# Patient Record
Sex: Female | Born: 1965 | Race: White | Hispanic: No | Marital: Married | Smoking: Never smoker
Health system: Southern US, Community
[De-identification: ages and names within clinical notes are randomized; demographics above are authoritative.]

## PROBLEM LIST (undated history)

## (undated) DIAGNOSIS — E039 Hypothyroidism, unspecified: Secondary | ICD-10-CM

## (undated) DIAGNOSIS — F419 Anxiety disorder, unspecified: Secondary | ICD-10-CM

## (undated) DIAGNOSIS — F99 Mental disorder, not otherwise specified: Secondary | ICD-10-CM

## (undated) DIAGNOSIS — I10 Essential (primary) hypertension: Secondary | ICD-10-CM

## (undated) HISTORY — PX: BACK SURGERY: SHX140

## (undated) HISTORY — PX: OTHER SURGICAL HISTORY: SHX169

## (undated) HISTORY — PX: ANTERIOR CERVICAL DECOMP/DISCECTOMY FUSION: SHX1161

---

## 2001-03-12 ENCOUNTER — Inpatient Hospital Stay (HOSPITAL_COMMUNITY): Admission: AD | Admit: 2001-03-12 | Discharge: 2001-03-15 | Payer: Self-pay | Admitting: Obstetrics and Gynecology

## 2003-01-23 ENCOUNTER — Other Ambulatory Visit: Admission: RE | Admit: 2003-01-23 | Discharge: 2003-01-23 | Payer: Self-pay | Admitting: Obstetrics and Gynecology

## 2003-03-09 ENCOUNTER — Ambulatory Visit (HOSPITAL_COMMUNITY): Admission: RE | Admit: 2003-03-09 | Discharge: 2003-03-09 | Payer: Self-pay | Admitting: Obstetrics and Gynecology

## 2003-03-09 ENCOUNTER — Encounter: Payer: Self-pay | Admitting: Obstetrics and Gynecology

## 2004-04-24 ENCOUNTER — Other Ambulatory Visit: Admission: RE | Admit: 2004-04-24 | Discharge: 2004-04-24 | Payer: Self-pay | Admitting: Obstetrics and Gynecology

## 2005-06-11 ENCOUNTER — Other Ambulatory Visit: Admission: RE | Admit: 2005-06-11 | Discharge: 2005-06-11 | Payer: Self-pay | Admitting: Obstetrics and Gynecology

## 2005-10-13 ENCOUNTER — Ambulatory Visit (HOSPITAL_COMMUNITY): Admission: RE | Admit: 2005-10-13 | Discharge: 2005-10-14 | Payer: Self-pay | Admitting: Neurosurgery

## 2011-02-28 ENCOUNTER — Other Ambulatory Visit: Payer: Self-pay | Admitting: Family Medicine

## 2011-02-28 DIAGNOSIS — N6002 Solitary cyst of left breast: Secondary | ICD-10-CM

## 2011-03-10 ENCOUNTER — Other Ambulatory Visit: Payer: Self-pay | Admitting: Family Medicine

## 2011-03-10 ENCOUNTER — Ambulatory Visit
Admission: RE | Admit: 2011-03-10 | Discharge: 2011-03-10 | Disposition: A | Discharge status: Home / Self Care | Payer: BC Managed Care – PPO | Source: Ambulatory Visit | Attending: Family Medicine | Admitting: Family Medicine

## 2011-03-10 DIAGNOSIS — N6002 Solitary cyst of left breast: Secondary | ICD-10-CM

## 2011-07-22 ENCOUNTER — Other Ambulatory Visit: Payer: Self-pay | Admitting: Family Medicine

## 2011-07-22 DIAGNOSIS — N6320 Unspecified lump in the left breast, unspecified quadrant: Secondary | ICD-10-CM

## 2011-07-22 DIAGNOSIS — Z09 Encounter for follow-up examination after completed treatment for conditions other than malignant neoplasm: Secondary | ICD-10-CM

## 2011-08-05 ENCOUNTER — Other Ambulatory Visit: Payer: BC Managed Care – PPO

## 2011-09-16 ENCOUNTER — Ambulatory Visit
Admission: RE | Admit: 2011-09-16 | Discharge: 2011-09-16 | Disposition: A | Discharge status: Home / Self Care | Payer: BC Managed Care – PPO | Source: Ambulatory Visit | Attending: Family Medicine | Admitting: Family Medicine

## 2011-09-16 DIAGNOSIS — Z09 Encounter for follow-up examination after completed treatment for conditions other than malignant neoplasm: Secondary | ICD-10-CM

## 2011-09-16 DIAGNOSIS — N6320 Unspecified lump in the left breast, unspecified quadrant: Secondary | ICD-10-CM

## 2012-06-17 ENCOUNTER — Encounter (HOSPITAL_BASED_OUTPATIENT_CLINIC_OR_DEPARTMENT_OTHER): Payer: Self-pay | Admitting: *Deleted

## 2012-06-17 NOTE — Progress Notes (Signed)
Bring all medications. Pt to call Glenice Laine to set up payment plan.

## 2012-06-18 ENCOUNTER — Encounter (HOSPITAL_BASED_OUTPATIENT_CLINIC_OR_DEPARTMENT_OTHER)
Admission: RE | Admit: 2012-06-18 | Discharge: 2012-06-18 | Disposition: A | Discharge status: Home / Self Care | Payer: BC Managed Care – PPO | Source: Ambulatory Visit | Attending: Orthopaedic Surgery | Admitting: Orthopaedic Surgery

## 2012-06-18 LAB — BASIC METABOLIC PANEL
BUN: 12 mg/dL (ref 6–23)
CO2: 22 mEq/L (ref 19–32)
Calcium: 9.7 mg/dL (ref 8.4–10.5)
Chloride: 102 mEq/L (ref 96–112)
Creatinine, Ser: 0.64 mg/dL (ref 0.50–1.10)
GFR calc Af Amer: >90 mL/min (ref >90)
GFR calc non Af Amer: >90 mL/min (ref >90)
Glucose, Bld: 89 mg/dL (ref 70–99)
Potassium: 3.9 mEq/L (ref 3.5–5.1)
Sodium: 138 mEq/L (ref 135–145)

## 2012-06-20 NOTE — H&P (Signed)
Wendy Huang is an 46 y.o. female.   Chief Complaint: Right knee pain HPI: She has a multiple month history of right knee pain and reduced motion. C/o of medial pain and swelling.  X-ray is wnl but MRI scan reveals a tmm. We have offered her arthroscopy to take care of this problem.  Past Medical History  Diagnosis Date  . Hypertension   . Hypothyroidism   . Anxiety   . Mental disorder     obessive-compulsive disorder    Past Surgical History  Procedure Date  . Anterior cervical decomp/discectomy fusion   . Reimplanted right ureter     at age 66 years  . Back surgery     cervical fusion    History reviewed. No pertinent family history. Social History:  reports that she has never smoked. She does not have any smokeless tobacco history on file. She reports that she drinks alcohol. She reports that she does not use illicit drugs.  Allergies: No Known Allergies  No prescriptions prior to admission    No results found for this or any previous visit (from the past 48 hour(s)). No results found.  Review of Systems  Constitutional: Negative.   HENT: Negative.   Eyes: Negative.   Respiratory: Negative.   Cardiovascular: Negative.   Gastrointestinal: Negative.   Genitourinary: Negative.   Musculoskeletal: Negative.   Skin: Negative.   Neurological: Negative.   Endo/Heme/Allergies: Negative.   Psychiatric/Behavioral: Negative.     Height 5\' 2"  (1.575 m), weight 86.183 kg (190 lb), last menstrual period 05/19/2012. Physical Exam  Constitutional: She appears well-nourished.  HENT:  Head: Atraumatic.  Eyes: Pupils are equal, round, and reactive to light.  Neck: Normal range of motion.  Cardiovascular: Regular rhythm.   Respiratory: Breath sounds normal.  GI: Soft.  Musculoskeletal:       Right knee pain at medial joint line ROM 5-120 +effusion  Good n/v  Neurological: She is alert.  Skin: Skin is warm.  Psychiatric: She has a normal mood and affect.      Assessment/Plan A: Right knee TMM P: Will proceed with a right knee scope to improve function and reduce pain. We have reviewed the risk / benefits of knee arthroscopy and the need for therapy post op.  Josmar Messimer R 06/20/2012, 8:04 PM

## 2012-06-22 ENCOUNTER — Encounter (HOSPITAL_BASED_OUTPATIENT_CLINIC_OR_DEPARTMENT_OTHER)
Admission: RE | Disposition: A | Discharge status: Home / Self Care | Payer: Self-pay | Source: Ambulatory Visit | Attending: Orthopaedic Surgery

## 2012-06-22 ENCOUNTER — Encounter (HOSPITAL_BASED_OUTPATIENT_CLINIC_OR_DEPARTMENT_OTHER): Payer: Self-pay | Admitting: Certified Registered Nurse Anesthetist

## 2012-06-22 ENCOUNTER — Ambulatory Visit (HOSPITAL_BASED_OUTPATIENT_CLINIC_OR_DEPARTMENT_OTHER)
Admission: RE | Admit: 2012-06-22 | Discharge: 2012-06-22 | Disposition: A | Discharge status: Home / Self Care | Payer: BC Managed Care – PPO | Source: Ambulatory Visit | Attending: Orthopaedic Surgery | Admitting: Orthopaedic Surgery

## 2012-06-22 ENCOUNTER — Ambulatory Visit (HOSPITAL_BASED_OUTPATIENT_CLINIC_OR_DEPARTMENT_OTHER): Payer: BC Managed Care – PPO | Admitting: Certified Registered Nurse Anesthetist

## 2012-06-22 ENCOUNTER — Encounter (HOSPITAL_BASED_OUTPATIENT_CLINIC_OR_DEPARTMENT_OTHER): Payer: Self-pay | Admitting: *Deleted

## 2012-06-22 DIAGNOSIS — M2240 Chondromalacia patellae, unspecified knee: Secondary | ICD-10-CM | POA: Insufficient documentation

## 2012-06-22 DIAGNOSIS — F411 Generalized anxiety disorder: Secondary | ICD-10-CM | POA: Insufficient documentation

## 2012-06-22 DIAGNOSIS — E039 Hypothyroidism, unspecified: Secondary | ICD-10-CM | POA: Insufficient documentation

## 2012-06-22 DIAGNOSIS — I10 Essential (primary) hypertension: Secondary | ICD-10-CM | POA: Insufficient documentation

## 2012-06-22 DIAGNOSIS — Z01810 Encounter for preprocedural cardiovascular examination: Secondary | ICD-10-CM | POA: Insufficient documentation

## 2012-06-22 DIAGNOSIS — Z01812 Encounter for preprocedural laboratory examination: Secondary | ICD-10-CM | POA: Insufficient documentation

## 2012-06-22 DIAGNOSIS — M23329 Other meniscus derangements, posterior horn of medial meniscus, unspecified knee: Secondary | ICD-10-CM | POA: Insufficient documentation

## 2012-06-22 DIAGNOSIS — S83249A Other tear of medial meniscus, current injury, unspecified knee, initial encounter: Secondary | ICD-10-CM

## 2012-06-22 HISTORY — DX: Essential (primary) hypertension: I10

## 2012-06-22 HISTORY — PX: KNEE ARTHROSCOPY: SHX127

## 2012-06-22 HISTORY — DX: Mental disorder, not otherwise specified: F99

## 2012-06-22 HISTORY — DX: Hypothyroidism, unspecified: E03.9

## 2012-06-22 HISTORY — DX: Anxiety disorder, unspecified: F41.9

## 2012-06-22 LAB — POCT HEMOGLOBIN-HEMACUE: Hemoglobin: 13.5 g/dL (ref 12.0–15.0)

## 2012-06-22 SURGERY — ARTHROSCOPY, KNEE
Anesthesia: General | Site: Knee | Laterality: Right | Wound class: Clean

## 2012-06-22 MED ORDER — CEFAZOLIN SODIUM 1-5 GM-% IV SOLN
INTRAVENOUS | Status: DC | PRN
  Administered 2012-06-22: 2 g via INTRAVENOUS

## 2012-06-22 MED ORDER — BUPIVACAINE HCL (PF) 0.5 % IJ SOLN
INTRAMUSCULAR | Status: DC | PRN
  Administered 2012-06-22: 20 mL via INTRA_ARTICULAR

## 2012-06-22 MED ORDER — FENTANYL CITRATE 0.05 MG/ML IJ SOLN
INTRAMUSCULAR | Status: DC | PRN
  Administered 2012-06-22: 50 ug via INTRAVENOUS
  Administered 2012-06-22 (×2): 25 ug via INTRAVENOUS
  Administered 2012-06-22: 50 ug via INTRAVENOUS
  Administered 2012-06-22 (×2): 25 ug via INTRAVENOUS

## 2012-06-22 MED ORDER — ONDANSETRON HCL 4 MG/2ML IJ SOLN
INTRAMUSCULAR | Status: DC | PRN
  Administered 2012-06-22: 4 mg via INTRAVENOUS

## 2012-06-22 MED ORDER — DEXAMETHASONE SODIUM PHOSPHATE 4 MG/ML IJ SOLN
INTRAMUSCULAR | Status: DC | PRN
  Administered 2012-06-22: 10 mg via INTRAVENOUS

## 2012-06-22 MED ORDER — PROPOFOL 10 MG/ML IV EMUL
INTRAVENOUS | Status: DC | PRN
  Administered 2012-06-22: 200 mg via INTRAVENOUS

## 2012-06-22 MED ORDER — OXYCODONE-ACETAMINOPHEN 5-325 MG PO TABS: 1.0000 | ORAL_TABLET | ORAL | Status: AC | PRN

## 2012-06-22 MED ORDER — LIDOCAINE HCL (CARDIAC) 20 MG/ML IV SOLN
INTRAVENOUS | Status: DC | PRN
  Administered 2012-06-22: 50 mg via INTRAVENOUS

## 2012-06-22 MED ORDER — ONDANSETRON HCL 4 MG/2ML IJ SOLN: 4.0000 mg | Freq: Once | INTRAMUSCULAR | Status: DC | PRN

## 2012-06-22 MED ORDER — OXYCODONE-ACETAMINOPHEN 5-325 MG PO TABS
1.0000 | ORAL_TABLET | Freq: Once | ORAL | Status: AC
  Administered 2012-06-22: 1 via ORAL

## 2012-06-22 MED ORDER — SODIUM CHLORIDE 0.9 % IR SOLN
Status: DC | PRN
  Administered 2012-06-22: 3

## 2012-06-22 MED ORDER — LACTATED RINGERS IV SOLN
INTRAVENOUS | Status: DC
  Administered 2012-06-22 (×2): via INTRAVENOUS

## 2012-06-22 MED ORDER — HYDROMORPHONE HCL PF 1 MG/ML IJ SOLN
0.2500 mg | INTRAMUSCULAR | Status: DC | PRN
  Administered 2012-06-22: 0.5 mg via INTRAVENOUS
  Administered 2012-06-22: 0.25 mg via INTRAVENOUS

## 2012-06-22 MED ORDER — MIDAZOLAM HCL 5 MG/5ML IJ SOLN
INTRAMUSCULAR | Status: DC | PRN
  Administered 2012-06-22: 2 mg via INTRAVENOUS

## 2012-06-22 SURGICAL SUPPLY — 40 items
BANDAGE ELASTIC 6 VELCRO ST LF (GAUZE/BANDAGES/DRESSINGS) ×2 IMPLANT
BANDAGE GAUZE ELAST BULKY 4 IN (GAUZE/BANDAGES/DRESSINGS) ×2 IMPLANT
BLADE CUDA 5.5 (BLADE) IMPLANT
BLADE CUDA GRT WHITE 3.5 (BLADE) ×2 IMPLANT
BLADE GREAT WHITE 4.2 (BLADE) ×2 IMPLANT
CANISTER OMNI JUG 16 LITER (MISCELLANEOUS) ×2 IMPLANT
CANISTER SUCTION 2500CC (MISCELLANEOUS) IMPLANT
DRAPE ARTHROSCOPY W/POUCH 114 (DRAPES) ×2 IMPLANT
DRAPE U-SHAPE 47X51 STRL (DRAPES) ×2 IMPLANT
DRSG EMULSION OIL 3X3 NADH (GAUZE/BANDAGES/DRESSINGS) ×2 IMPLANT
DURAPREP 26ML APPLICATOR (WOUND CARE) ×2 IMPLANT
ELECT MENISCUS 165MM 90D (ELECTRODE) IMPLANT
ELECT REM PT RETURN 9FT ADLT (ELECTROSURGICAL)
ELECTRODE REM PT RTRN 9FT ADLT (ELECTROSURGICAL) IMPLANT
GLOVE BIO SURGEON STRL SZ 6.5 (GLOVE) ×2 IMPLANT
GLOVE BIO SURGEON STRL SZ8.5 (GLOVE) ×2 IMPLANT
GLOVE BIOGEL PI IND STRL 7.0 (GLOVE) ×1 IMPLANT
GLOVE BIOGEL PI IND STRL 8 (GLOVE) ×1 IMPLANT
GLOVE BIOGEL PI IND STRL 8.5 (GLOVE) ×1 IMPLANT
GLOVE BIOGEL PI INDICATOR 7.0 (GLOVE) ×1
GLOVE BIOGEL PI INDICATOR 8 (GLOVE) ×1
GLOVE BIOGEL PI INDICATOR 8.5 (GLOVE) ×1
GLOVE SS BIOGEL STRL SZ 8 (GLOVE) ×1 IMPLANT
GLOVE SUPERSENSE BIOGEL SZ 8 (GLOVE) ×1
GOWN PREVENTION PLUS XLARGE (GOWN DISPOSABLE) ×4 IMPLANT
GOWN PREVENTION PLUS XXLARGE (GOWN DISPOSABLE) ×2 IMPLANT
KNEE WRAP E Z 3 GEL PACK (MISCELLANEOUS) ×2 IMPLANT
PACK ARTHROSCOPY DSU (CUSTOM PROCEDURE TRAY) ×2 IMPLANT
PACK BASIN DAY SURGERY FS (CUSTOM PROCEDURE TRAY) ×2 IMPLANT
PENCIL BUTTON HOLSTER BLD 10FT (ELECTRODE) IMPLANT
SET ARTHROSCOPY TUBING (MISCELLANEOUS) ×1
SET ARTHROSCOPY TUBING LN (MISCELLANEOUS) ×1 IMPLANT
SHEET MEDIUM DRAPE 40X70 STRL (DRAPES) ×2 IMPLANT
SPONGE GAUZE 4X4 12PLY (GAUZE/BANDAGES/DRESSINGS) ×2 IMPLANT
SYR 3ML 18GX1 1/2 (SYRINGE) IMPLANT
TOWEL OR 17X24 6PK STRL BLUE (TOWEL DISPOSABLE) ×2 IMPLANT
TOWEL OR NON WOVEN STRL DISP B (DISPOSABLE) ×2 IMPLANT
WAND 30 DEG SABER W/CORD (SURGICAL WAND) IMPLANT
WAND STAR VAC 90 (SURGICAL WAND) IMPLANT
WATER STERILE IRR 1000ML POUR (IV SOLUTION) ×2 IMPLANT

## 2012-06-22 NOTE — Anesthesia Procedure Notes (Signed)
Procedure Name: LMA Insertion Date/Time: 06/22/2012 11:24 AM Performed by: Iram Lundberg D Pre-anesthesia Checklist: Patient identified, Emergency Drugs available, Suction available and Patient being monitored Patient Re-evaluated:Patient Re-evaluated prior to inductionOxygen Delivery Method: Circle System Utilized Preoxygenation: Pre-oxygenation with 100% oxygen Intubation Type: IV induction Ventilation: Mask ventilation without difficulty LMA: LMA inserted LMA Size: 4.0 Number of attempts: 1 Airway Equipment and Method: bite block Placement Confirmation: positive ETCO2 Tube secured with: Tape Dental Injury: Teeth and Oropharynx as per pre-operative assessment

## 2012-06-22 NOTE — Op Note (Signed)
#  [id number] 

## 2012-06-22 NOTE — Anesthesia Postprocedure Evaluation (Signed)
  Anesthesia Post-op Note  Patient: Wendy Huang  Procedure(s) Performed: Procedure(s) (LRB): ARTHROSCOPY KNEE (Right)  Patient Location: PACU  Anesthesia Type: General  Level of Consciousness: awake, alert  and oriented  Airway and Oxygen Therapy: Patient Spontanous Breathing  Post-op Pain: mild  Post-op Assessment: Post-op Vital signs reviewed  Post-op Vital Signs: Reviewed  Complications: No apparent anesthesia complications

## 2012-06-22 NOTE — Transfer of Care (Signed)
Immediate Anesthesia Transfer of Care Note  Patient: Wendy Huang  Procedure(s) Performed: Procedure(s) (LRB): ARTHROSCOPY KNEE (Right)  Patient Location: PACU  Anesthesia Type: General  Level of Consciousness: awake, alert , oriented and patient cooperative  Airway & Oxygen Therapy: Patient Spontanous Breathing and Patient connected to face mask oxygen  Post-op Assessment: Report given to PACU RN and Post -op Vital signs reviewed and stable  Post vital signs: Reviewed and stable  Complications: No apparent anesthesia complications

## 2012-06-22 NOTE — Anesthesia Preprocedure Evaluation (Signed)
Anesthesia Evaluation  Patient identified by MRN, date of birth, ID band Patient awake    Reviewed: Allergy & Precautions, H&P , NPO status , Patient's Chart, lab work & pertinent test results  Airway Mallampati: I TM Distance: >3 FB Neck ROM: Full    Dental  (+) Dental Advisory Given   Pulmonary  breath sounds clear to auscultation        Cardiovascular hypertension, Pt. on medications Rhythm:Regular     Neuro/Psych    GI/Hepatic   Endo/Other  Hypothyroidism Morbid obesity  Renal/GU      Musculoskeletal   Abdominal   Peds  Hematology   Anesthesia Other Findings   Reproductive/Obstetrics                           Anesthesia Physical Anesthesia Plan  ASA: II  Anesthesia Plan: General   Post-op Pain Management:    Induction: Intravenous  Airway Management Planned: LMA  Additional Equipment:   Intra-op Plan:   Post-operative Plan:   Informed Consent: I have reviewed the patients History and Physical, chart, labs and discussed the procedure including the risks, benefits and alternatives for the proposed anesthesia with the patient or authorized representative who has indicated his/her understanding and acceptance.   Dental advisory given  Plan Discussed with: CRNA, Anesthesiologist and Surgeon  Anesthesia Plan Comments:         Anesthesia Quick Evaluation

## 2012-06-22 NOTE — Interval H&P Note (Signed)
History and Physical Interval Note:  06/22/2012 10:52 AM  Wendy Huang  has presented today for surgery, with the diagnosis of right knee mmt, chondromalacia  The various methods of treatment have been discussed with the patient and family. After consideration of risks, benefits and other options for treatment, the patient has consented to  Procedure(s) (LRB): ARTHROSCOPY KNEE (Right) as a surgical intervention .  The patient's history has been reviewed, patient examined, no change in status, stable for surgery.  I have reviewed the patients' chart and labs.  Questions were answered to the patient's satisfaction.     [name] G

## 2012-06-23 NOTE — Op Note (Signed)
Wendy Huang, Wendy Huang            ACCOUNT NO.:  [id number]  MEDICAL RECORD NO.:  [id number]  LOCATION:                                 FACILITY:  PHYSICIAN:  Lubertha Basque. Godson Pollan, M.D.DATE OF BIRTH:  January 26, 1966  DATE OF PROCEDURE:  06/22/2012 DATE OF DISCHARGE:                              OPERATIVE REPORT   PREOPERATIVE DIAGNOSES: 1. Right knee torn medial meniscus. 2. Right knee chondromalacia.  POSTOPERATIVE DIAGNOSES: 1. Right knee torn medial meniscus. 2. Right knee chondromalacia.  PROCEDURES: 1. Right knee partial meniscectomy. 2. Abrasion chondroplasty.  ANESTHESIA:  General.  ATTENDING SURGEON:  Lubertha Basque. Jerl Santos, MD.  ASSISTANTLindwood Qua, PA.  INDICATION FOR PROCEDURE:  The patient is a 46 year old woman who has many months of right knee pain and an inability to extend it fully.  She has persisted with swelling and a limp, and despite conservative measures, she has persisted with her problem.  She cannot rest at night and has trouble with walking and working.  By MRI scan, she has an impressive medial meniscus tear.  She is offered an arthroscopy. Informed operative consent was obtained after discussion of possible complications including reaction to anesthesia and infection.  SUMMARY OF FINDINGS AND PROCEDURE:  Under general anesthesia, an arthroscopy of the right knee was performed.  The suprapatellar pouch was notable for some chondromalacia of patella, but the patella tracked very well.  The medial compartment exhibited some grade 2 chondromalacia on the medial femoral condyle with a complex posterior horn medial meniscus tear.  We performed about a 25% partial medial meniscectomy using various baskets and shavers taking this back to a stable rim.  The ACL appeared to be intact.  There was some soft tissue stuck in the notch, which we removed.  The lateral compartment was benign with no evidence of meniscal or articular cartilage injury.  We did  perform a brief abrasion to bleeding bone, patellofemoral.  The knee was thoroughly irrigated and she was discharged home.  DESCRIPTION OF PROCEDURE:  The patient was taken to the operating suite where general anesthetic was applied without difficulty.  She was positioned supine and prepped and draped in normal sterile fashion. After administration of IV Kefzol and an appropriate time out, an arthroscopy of the right knee was performed through a total of 2 portals.  Findings were as noted above and procedure consisted of the abrasion chondroplasty patellofemoral, followed by the partial medial meniscectomy done with baskets and shavers.  The knee was thoroughly irrigated, followed by placement of Marcaine with epinephrine and morphine.  Adaptic was placed over the portals, followed by dry gauze and loose Ace wrap.  Estimated blood loss and fluids can be obtained from anesthesia records.  DISPOSITION:  The patient was extubated in operating room and taken to recovery room in stable condition.  She was to go home on the same-day and follow up in the office closely.  I will contact her by phone tonight.     Lubertha Basque Jerl Santos, M.D.     PGD/MEDQ  D:  06/22/2012  T:  06/22/2012  Job:  [id number]

## 2012-06-25 ENCOUNTER — Encounter (HOSPITAL_BASED_OUTPATIENT_CLINIC_OR_DEPARTMENT_OTHER): Payer: Self-pay | Admitting: Orthopaedic Surgery

## 2012-06-29 ENCOUNTER — Encounter (HOSPITAL_BASED_OUTPATIENT_CLINIC_OR_DEPARTMENT_OTHER): Payer: Self-pay

## 2013-01-10 ENCOUNTER — Other Ambulatory Visit: Payer: Self-pay | Admitting: Family Medicine

## 2013-01-10 DIAGNOSIS — Z1231 Encounter for screening mammogram for malignant neoplasm of breast: Secondary | ICD-10-CM

## 2013-02-04 ENCOUNTER — Ambulatory Visit: Payer: BC Managed Care – PPO

## 2013-03-04 ENCOUNTER — Ambulatory Visit
Admission: RE | Admit: 2013-03-04 | Discharge: 2013-03-04 | Disposition: A | Discharge status: Home / Self Care | Payer: BC Managed Care – PPO | Source: Ambulatory Visit | Attending: Family Medicine | Admitting: Family Medicine

## 2013-03-04 DIAGNOSIS — Z1231 Encounter for screening mammogram for malignant neoplasm of breast: Secondary | ICD-10-CM

## 2014-05-08 ENCOUNTER — Other Ambulatory Visit: Payer: Self-pay

## 2014-05-08 DIAGNOSIS — Z1231 Encounter for screening mammogram for malignant neoplasm of breast: Secondary | ICD-10-CM

## 2014-05-26 ENCOUNTER — Ambulatory Visit
Admission: RE | Admit: 2014-05-26 | Discharge: 2014-05-26 | Disposition: A | Discharge status: Home / Self Care | Payer: BC Managed Care – PPO | Source: Ambulatory Visit

## 2014-05-26 ENCOUNTER — Encounter (INDEPENDENT_AMBULATORY_CARE_PROVIDER_SITE_OTHER): Payer: Self-pay

## 2014-05-26 DIAGNOSIS — Z1231 Encounter for screening mammogram for malignant neoplasm of breast: Secondary | ICD-10-CM

## 2016-06-02 ENCOUNTER — Other Ambulatory Visit: Payer: Self-pay | Admitting: Family Medicine

## 2016-06-02 DIAGNOSIS — Z1231 Encounter for screening mammogram for malignant neoplasm of breast: Secondary | ICD-10-CM

## 2016-06-10 ENCOUNTER — Ambulatory Visit
Admission: RE | Admit: 2016-06-10 | Discharge: 2016-06-10 | Disposition: A | Discharge status: Home / Self Care | Payer: BC Managed Care – PPO | Source: Ambulatory Visit | Attending: Family Medicine | Admitting: Family Medicine

## 2016-06-10 DIAGNOSIS — Z1231 Encounter for screening mammogram for malignant neoplasm of breast: Secondary | ICD-10-CM

## 2016-08-18 ENCOUNTER — Telehealth: Payer: Self-pay | Admitting: Hematology and Oncology

## 2016-08-18 ENCOUNTER — Encounter: Payer: Self-pay | Admitting: Hematology and Oncology

## 2016-08-18 NOTE — Telephone Encounter (Signed)
Pt confirmed appt. Completed intake, mailed new pt letter, faxed referring provider appt date/time

## 2016-09-05 ENCOUNTER — Other Ambulatory Visit: Payer: Self-pay

## 2016-09-08 ENCOUNTER — Other Ambulatory Visit (HOSPITAL_BASED_OUTPATIENT_CLINIC_OR_DEPARTMENT_OTHER): Payer: BC Managed Care – PPO

## 2016-09-08 ENCOUNTER — Ambulatory Visit (HOSPITAL_BASED_OUTPATIENT_CLINIC_OR_DEPARTMENT_OTHER): Payer: BC Managed Care – PPO | Admitting: Hematology and Oncology

## 2016-09-08 ENCOUNTER — Encounter: Payer: Self-pay | Admitting: Hematology and Oncology

## 2016-09-08 ENCOUNTER — Other Ambulatory Visit (HOSPITAL_COMMUNITY)
Admission: RE | Admit: 2016-09-08 | Discharge: 2016-09-08 | Disposition: A | Discharge status: Home / Self Care | Payer: BC Managed Care – PPO | Source: Ambulatory Visit | Attending: Hematology and Oncology | Admitting: Hematology and Oncology

## 2016-09-08 DIAGNOSIS — D72829 Elevated white blood cell count, unspecified: Secondary | ICD-10-CM | POA: Insufficient documentation

## 2016-09-08 LAB — CBC WITH DIFFERENTIAL/PLATELET
BASO%: 0.4 % (ref 0.0–2.0)
Basophils Absolute: 0.1 10*3/uL (ref 0.0–0.1)
EOS ABS: 0.2 10*3/uL (ref 0.0–0.5)
EOS%: 1.6 % (ref 0.0–7.0)
HEMATOCRIT: 36.7 % (ref 34.8–46.6)
HGB: 12.2 g/dL (ref 11.6–15.9)
LYMPH#: 3.6 10*3/uL — AB (ref 0.9–3.3)
LYMPH%: 27.9 % (ref 14.0–49.7)
MCH: 28 pg (ref 25.1–34.0)
MCHC: 33.2 g/dL (ref 31.5–36.0)
MCV: 84.4 fL (ref 79.5–101.0)
MONO#: 0.6 10*3/uL (ref 0.1–0.9)
MONO%: 5 % (ref 0.0–14.0)
NEUT%: 65.1 % (ref 38.4–76.8)
NEUTROS ABS: 8.4 10*3/uL — AB (ref 1.5–6.5)
PLATELETS: 445 10*3/uL — AB (ref 145–400)
RBC: 4.35 10*6/uL (ref 3.70–5.45)
RDW: 14.9 % — ABNORMAL HIGH (ref 11.2–14.5)
WBC: 12.9 10*3/uL — AB (ref 3.9–10.3)

## 2016-09-08 NOTE — Progress Notes (Signed)
[facility] CONSULT NOTE  No care team member to display  CHIEF COMPLAINTS/PURPOSE OF CONSULTATION:  Leukocytosis  HISTORY OF PRESENTING ILLNESS:  Wendy Huang 50 y.o. female is here because of recent diagnosis of leukocytosis. She has a history of hypothyroidism for which she takes thyroid supplementation. She was recently started on a weight loss medication as she is actively trying to lose weight. 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 She had blood work in June 2017 which showed a white blood cell count of 13.2 with a platelet of 411. This was repeated again on August in the white blood cell count continued to be mildly elevated at 13.5 with a platelet count of 440. This time she had a differential which showed neutrophil count of 9100 and lymphocyte count of 3400. Because of persistent leukocytosis, she was referred to see [place].  I reviewed her records extensively and collaborated the history with the patient.   MEDICAL HISTORY:  Past Medical History:  Diagnosis Date  . Anxiety   . Hypertension   . Hypothyroidism   . Mental disorder    obessive-compulsive disorder    SURGICAL HISTORY: Past Surgical History:  Procedure Laterality Date  . ANTERIOR CERVICAL DECOMP/DISCECTOMY FUSION    . BACK SURGERY     cervical fusion  . KNEE ARTHROSCOPY  06/22/2012   Procedure: ARTHROSCOPY KNEE;  Surgeon: Velna Ochs, MD;  Location: [facility];  Service: Orthopedics;  Laterality: Right;  . reimplanted right ureter     at age 33 years    SOCIAL HISTORY: Social History   Social History  . Marital status: Married    Spouse name: N/A  . Number of children: N/A  . Years of education: N/A   Occupational History  . Not on file.   Social History Main Topics  . Smoking status: Never Smoker  . Smokeless tobacco: Not on file  . Alcohol use Yes     Comment: socially  . Drug use: No  . Sexual  activity: Not on file   Other Topics Concern  . Not on file   Social History Narrative  . No narrative on file    FAMILY HISTORY: No family history on file.  ALLERGIES:  has No Known Allergies.  MEDICATIONS:  Current Outpatient Prescriptions  Medication Sig Dispense Refill  . ALPRAZolam (XANAX) 0.5 MG tablet Take 0.5 mg by mouth at bedtime as needed.    Marland Kitchen amLODipine (NORVASC) 5 MG tablet Take 5 mg by mouth daily.    Marland Kitchen levothyroxine (SYNTHROID, LEVOTHROID) 112 MCG tablet Take 112 mcg by mouth daily.     No current facility-administered medications for this visit.     REVIEW OF SYSTEMS:   Constitutional: Denies fevers, chills or abnormal night sweats Eyes: Denies blurriness of vision, double vision or watery eyes Ears, nose, mouth, throat, and face: Denies mucositis or sore throat Respiratory: Denies cough, dyspnea or wheezes Cardiovascular: Denies palpitation, chest discomfort or lower extremity swelling Gastrointestinal:  Denies nausea, heartburn or change in bowel habits Skin: Denies abnormal skin rashes Lymphatics: Denies new lymphadenopathy or easy bruising Neurological:Denies numbness, tingling or new weaknesses Behavioral/Psych: Mood is stable, no new changes   All other systems were reviewed with the patient and are negative.  PHYSICAL EXAMINATION: ECOG PERFORMANCE STATUS: 0 - Asymptomatic  Vitals:   09/08/16 1254  BP: (!) 145/80  Pulse: 91  Resp: 18  Temp: 98.4 F (36.9 C)  Filed Weights   09/08/16 1254  Weight: 197 lb 6.4 oz (89.5 kg)    GENERAL:alert, no distress and comfortable SKIN: skin color, texture, turgor are normal, no rashes or significant lesions EYES: normal, conjunctiva are pink and non-injected, sclera clear OROPHARYNX:no exudate, no erythema and lips, buccal mucosa, and tongue normal  NECK: supple, thyroid normal size, non-tender, without nodularity LYMPH:  no palpable lymphadenopathy in the cervical, axillary or inguinal LUNGS:  clear to auscultation and percussion with normal breathing effort HEART: regular rate & rhythm and no murmurs and no lower extremity edema ABDOMEN:abdomen soft, non-tender and normal bowel sounds Musculoskeletal:no cyanosis of digits and no clubbing  PSYCH: alert & oriented x 3 with fluent speech NEURO: no focal motor/sensory deficits   LABORATORY DATA:  I have reviewed the data as listed Lab Results  Component Value Date   HGB 13.5 06/22/2012   Lab Results  Component Value Date   NA 138 06/18/2012   K 3.9 06/18/2012   CL 102 06/18/2012   CO2 22 06/18/2012    RADIOGRAPHIC STUDIES: I have personally reviewed the radiological reports and agreed with the findings in the report.  ASSESSMENT AND PLAN:  Leukocytosis Blood work done on 07/22/2016: WBC count 13.5, ANC 9.1, ALC 3.4, platelet count 440, hemoglobin 12.6 TSH 0.05 Normal LFTs  Differential diagnosis of leukocytosis primarily neutrophilia 1. Inflammation 2. Infection 3. Myeloproliferative diseases  Mild elevation of platelets: I suspect it may be also due to underlying inflammation.  Recommendation/workup: 1. Evaluate peripheral smear 2. flow cytometry 3. JAK-2 mutation testing  4. CRP 5. BCR-ABL  Return to clinic in 2 weeks to discuss the results of the tests   All questions were answered. The patient knows to call the clinic with any problems, questions or concerns.    Sabas SousGudena, Lorien Shingler K, MD 09/08/16

## 2016-09-08 NOTE — Assessment & Plan Note (Signed)
Blood work done on 07/22/2016: WBC count 13.5, ANC 9.1, ALC 3.4, platelet count 440, hemoglobin 12.6 TSH 0.05 Normal LFTs  Differential diagnosis of leukocytosis primarily neutrophilia 1. Inflammation 2. Infection 3. Myeloproliferative diseases  Recommendation/workup: 1. Evaluate peripheral smear 2. flow cytometry 3. JAK-2 mutation testing  4. CRP  Return to clinic in 2 weeks to discuss the results of the tests

## 2016-09-09 LAB — C-REACTIVE PROTEIN: CRP: 14.8 mg/L — ABNORMAL HIGH (ref 0.0–4.9)

## 2016-09-10 LAB — FLOW CYTOMETRY

## 2016-09-22 NOTE — Assessment & Plan Note (Signed)
Blood work done on 07/22/2016: WBC count 13.5, ANC 9.1, ALC 3.4, platelet count 440, hemoglobin 12.6 TSH 0.05 Normal LFTs  Differential diagnosis of leukocytosis primarily neutrophilia 1. Inflammation 2. Infection 3. Myeloproliferative diseases  Mild elevation of platelets: I suspect it may be also due to underlying inflammation.  Recommendation/workup: 1. Evaluate peripheral smear: No abnormal cells identified 2. flow cytometry: No monoclonal B cell population or abnormal T cell phenotype identified 3. JAK-2 mutation testing  4. CRP: 14.8 5. BCR-ABL: Neg  I discussed the results and the cause of the elevated neutrophil count is underlying inflammation. There is no role of bone marrow biopsy.

## 2016-09-23 ENCOUNTER — Encounter: Payer: Self-pay | Admitting: Hematology and Oncology

## 2016-09-23 ENCOUNTER — Ambulatory Visit (HOSPITAL_BASED_OUTPATIENT_CLINIC_OR_DEPARTMENT_OTHER): Payer: BC Managed Care – PPO | Admitting: Hematology and Oncology

## 2016-09-23 DIAGNOSIS — D72829 Elevated white blood cell count, unspecified: Secondary | ICD-10-CM | POA: Diagnosis not present

## 2016-09-23 NOTE — Progress Notes (Signed)
DIAGNOSIS: Follow-up of leukocytosis  CHIEF COMPLIANT: Follow-up to discuss results of the blood work  INTERVAL HISTORY: Wendy Huang is a 50 year old with above-mentioned history of mild leukocytosis who had extensive blood work and is here today to discuss the results. She reports no new symptoms or concerns. She has not had any symptoms of arthritis or any signs of inflammation. The blood work came back with marked elevation of C-reactive protein suggestive of inflammation. Rest of the other blood work came back negative for conditions like CML, myeloproliferative diseases or leukemia. She denies any fevers or chills. Denies any night sweats or weight loss or any lymphadenopathy.  REVIEW OF SYSTEMS:   Constitutional: Denies fevers, chills or abnormal weight loss Eyes: Denies blurriness of vision Ears, nose, mouth, throat, and face: Denies mucositis or sore throat Respiratory: Denies cough, dyspnea or wheezes Cardiovascular: Denies palpitation, chest discomfort Gastrointestinal:  Denies nausea, heartburn or change in bowel habits Skin: Denies abnormal skin rashes Lymphatics: Denies new lymphadenopathy or easy bruising Neurological:Denies numbness, tingling or new weaknesses Behavioral/Psych: Mood is stable, no new changes  Extremities: No lower extremity edema All other systems were reviewed with the patient and are negative.  I have reviewed the past medical history, past surgical history, social history and family history with the patient and they are unchanged from previous note.  ALLERGIES:  has No Known Allergies.  MEDICATIONS:  Current Outpatient Prescriptions  Medication Sig Dispense Refill  . ALPRAZolam (XANAX) 0.5 MG tablet Take 0.5 mg by mouth at bedtime as needed.    Marland Kitchen amLODipine (NORVASC) 5 MG tablet Take 5 mg by mouth daily.    Marland Kitchen levothyroxine (SYNTHROID, LEVOTHROID) 112 MCG tablet Take 112 mcg by mouth daily.     No current facility-administered medications for  this visit.     PHYSICAL EXAMINATION: ECOG PERFORMANCE STATUS: 0 - Asymptomatic  Vitals:   09/23/16 1416  BP: (!) 147/75  Pulse: 95  Resp: 18  Temp: 98.3 F (36.8 C)   Filed Weights   09/23/16 1416  Weight: 195 lb 9.6 oz (88.7 kg)    GENERAL:alert, no distress and comfortable SKIN: skin color, texture, turgor are normal, no rashes or significant lesions EYES: normal, Conjunctiva are pink and non-injected, sclera clear OROPHARYNX:no exudate, no erythema and lips, buccal mucosa, and tongue normal  NECK: supple, thyroid normal size, non-tender, without nodularity LYMPH:  no palpable lymphadenopathy in the cervical, axillary or inguinal LUNGS: clear to auscultation and percussion with normal breathing effort HEART: regular rate & rhythm and no murmurs and no lower extremity edema ABDOMEN:abdomen soft, non-tender and normal bowel sounds MUSCULOSKELETAL:no cyanosis of digits and no clubbing  NEURO: alert & oriented x 3 with fluent speech, no focal motor/sensory deficits EXTREMITIES: No lower extremity edema  LABORATORY DATA:  I have reviewed the data as listed   Chemistry      Component Value Date/Time   NA 138 06/18/2012 1445   K 3.9 06/18/2012 1445   CL 102 06/18/2012 1445   CO2 22 06/18/2012 1445   BUN 12 06/18/2012 1445   CREATININE 0.64 06/18/2012 1445      Component Value Date/Time   CALCIUM 9.7 06/18/2012 1445       Lab Results  Component Value Date   WBC 12.9 (H) 09/08/2016   HGB 12.2 09/08/2016   HCT 36.7 09/08/2016   MCV 84.4 09/08/2016   PLT 445 (H) 09/08/2016   NEUTROABS 8.4 (H) 09/08/2016     ASSESSMENT & PLAN:  Leukocytosis Blood  work done on 07/22/2016: WBC count 13.5, ANC 9.1, ALC 3.4, platelet count 440, hemoglobin 12.6 TSH 0.05 Normal LFTs  Differential diagnosis of leukocytosis primarily neutrophilia 1. Inflammation 2. Infection 3. Myeloproliferative diseases  Mild elevation of platelets: I suspect it may be also due to underlying  inflammation.  Recommendation/workup: 1. Evaluate peripheral smear: No abnormal cells identified 2. flow cytometry: No monoclonal B cell population or abnormal T cell phenotype identified 3. JAK-2 mutation testing  4. CRP: 14.8 5. BCR-ABL: Neg  I discussed the results and the cause of the elevated neutrophil count is underlying inflammation. There is no role of bone marrow biopsy.  [employer] to decrease inflammation: I discussed with her that since we cannot pinpoint the source of inflammation, there is some general ways to decrease inflammation. We discussed the significance of red meat and causing inflammation as well as importance of vitamin D and turmeric in decreasing inflammation.  Cyst that is no primary bone marrow pathology of concern that it can explain the granulocytosis, there is no further treatment or workup necessary at this point. If she were to need to see us back we are happy to make another appointment. Especially if her granulocyte count starts to double then I would need to see her back to do a bone marrow biopsy.   She will be seen on an as-needed basis.  No orders of the defined types were placed in this encounter.  The patient has a good understanding of the overall plan. she agrees with it. she will call with any problems that may develop before the next visit here.   Gudena, Vinay K, MD 09/23/16    

## 2017-11-02 ENCOUNTER — Other Ambulatory Visit: Payer: Self-pay | Admitting: Family Medicine

## 2017-11-02 DIAGNOSIS — Z1231 Encounter for screening mammogram for malignant neoplasm of breast: Secondary | ICD-10-CM

## 2017-11-30 ENCOUNTER — Ambulatory Visit: Payer: BC Managed Care – PPO

## 2018-01-26 ENCOUNTER — Ambulatory Visit
Admission: RE | Admit: 2018-01-26 | Discharge: 2018-01-26 | Disposition: A | Discharge status: Home / Self Care | Payer: BC Managed Care – PPO | Source: Ambulatory Visit | Attending: Family Medicine | Admitting: Family Medicine

## 2018-01-26 DIAGNOSIS — Z1231 Encounter for screening mammogram for malignant neoplasm of breast: Secondary | ICD-10-CM

## 2018-01-27 ENCOUNTER — Other Ambulatory Visit: Payer: Self-pay | Admitting: Family Medicine

## 2018-01-27 DIAGNOSIS — R928 Other abnormal and inconclusive findings on diagnostic imaging of breast: Secondary | ICD-10-CM

## 2018-02-02 ENCOUNTER — Ambulatory Visit
Admission: RE | Admit: 2018-02-02 | Discharge: 2018-02-02 | Disposition: A | Discharge status: Home / Self Care | Payer: BC Managed Care – PPO | Source: Ambulatory Visit | Attending: Family Medicine | Admitting: Family Medicine

## 2018-02-02 DIAGNOSIS — R928 Other abnormal and inconclusive findings on diagnostic imaging of breast: Secondary | ICD-10-CM

## 2019-10-14 ENCOUNTER — Other Ambulatory Visit: Payer: Self-pay | Admitting: Family Medicine

## 2019-10-14 DIAGNOSIS — Z1231 Encounter for screening mammogram for malignant neoplasm of breast: Secondary | ICD-10-CM

## 2019-12-02 ENCOUNTER — Ambulatory Visit
Admission: RE | Admit: 2019-12-02 | Discharge: 2019-12-02 | Disposition: A | Discharge status: Home / Self Care | Payer: BC Managed Care – PPO | Source: Ambulatory Visit | Attending: Family Medicine | Admitting: Family Medicine

## 2019-12-02 ENCOUNTER — Other Ambulatory Visit: Payer: Self-pay

## 2019-12-02 DIAGNOSIS — Z1231 Encounter for screening mammogram for malignant neoplasm of breast: Secondary | ICD-10-CM

## 2021-05-17 ENCOUNTER — Other Ambulatory Visit: Payer: Self-pay | Admitting: Family Medicine

## 2021-05-17 DIAGNOSIS — Z1231 Encounter for screening mammogram for malignant neoplasm of breast: Secondary | ICD-10-CM

## 2021-06-07 ENCOUNTER — Other Ambulatory Visit: Payer: Self-pay

## 2021-06-07 ENCOUNTER — Ambulatory Visit
Admission: RE | Admit: 2021-06-07 | Discharge: 2021-06-07 | Disposition: A | Discharge status: Home / Self Care | Payer: BC Managed Care – PPO | Source: Ambulatory Visit | Attending: Family Medicine | Admitting: Family Medicine

## 2021-06-07 DIAGNOSIS — Z1231 Encounter for screening mammogram for malignant neoplasm of breast: Secondary | ICD-10-CM

## 2022-07-03 ENCOUNTER — Other Ambulatory Visit: Payer: Self-pay | Admitting: Family Medicine

## 2022-07-03 DIAGNOSIS — Z1231 Encounter for screening mammogram for malignant neoplasm of breast: Secondary | ICD-10-CM

## 2022-07-04 ENCOUNTER — Ambulatory Visit
Admission: RE | Admit: 2022-07-04 | Discharge: 2022-07-04 | Disposition: A | Discharge status: Home / Self Care | Payer: BC Managed Care – PPO | Source: Ambulatory Visit | Attending: Family Medicine | Admitting: Family Medicine

## 2022-07-04 DIAGNOSIS — Z1231 Encounter for screening mammogram for malignant neoplasm of breast: Secondary | ICD-10-CM

## 2023-02-26 ENCOUNTER — Other Ambulatory Visit: Payer: Self-pay | Admitting: Nurse Practitioner

## 2023-02-26 ENCOUNTER — Other Ambulatory Visit (HOSPITAL_COMMUNITY)
Admission: RE | Admit: 2023-02-26 | Discharge: 2023-02-26 | Disposition: A | Discharge status: Home / Self Care | Payer: BC Managed Care – PPO | Source: Ambulatory Visit | Attending: Nurse Practitioner | Admitting: Nurse Practitioner

## 2023-02-26 DIAGNOSIS — Z124 Encounter for screening for malignant neoplasm of cervix: Secondary | ICD-10-CM | POA: Insufficient documentation

## 2023-03-02 LAB — CYTOLOGY - PAP
Comment: NEGATIVE
Diagnosis: NEGATIVE
High risk HPV: NEGATIVE

## 2023-04-03 IMAGING — MG MM DIGITAL SCREENING BILAT W/ TOMO AND CAD
8 series · 8 of 24 positions shown · non-contrast
Comparison: Previous exam(s).

CLINICAL DATA: Screening.

EXAM:
DIGITAL SCREENING BILATERAL MAMMOGRAM WITH TOMOSYNTHESIS AND CAD
TECHNIQUE: Bilateral screening digital craniocaudal and mediolateral oblique
mammograms were obtained. Bilateral screening digital breast
tomosynthesis was performed. The images were evaluated with
computer-aided detection.

[R MLO synth-2D]
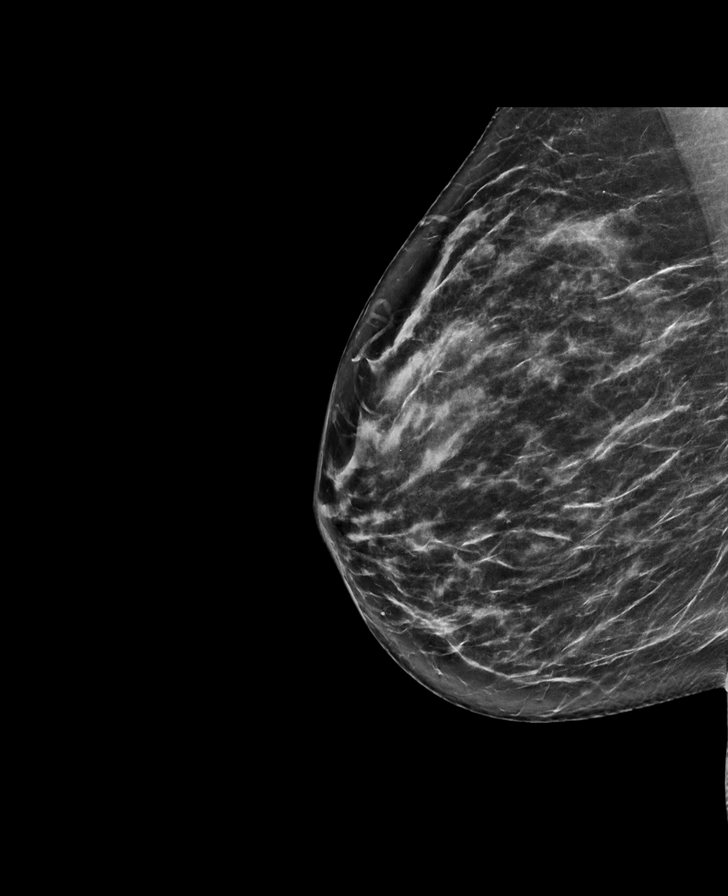

[R CC synth-2D]
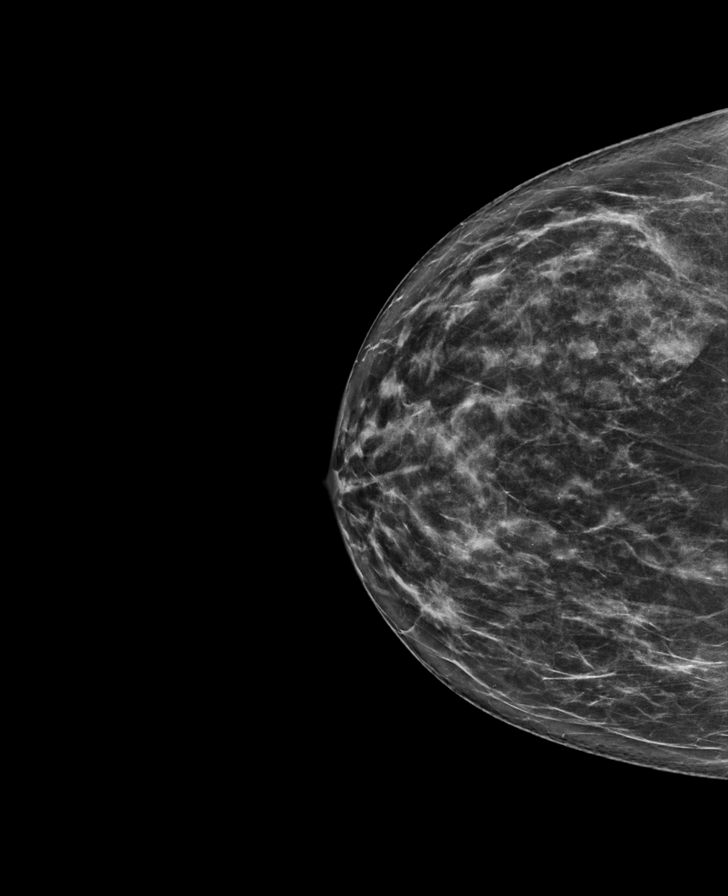

[L MLO synth-2D]
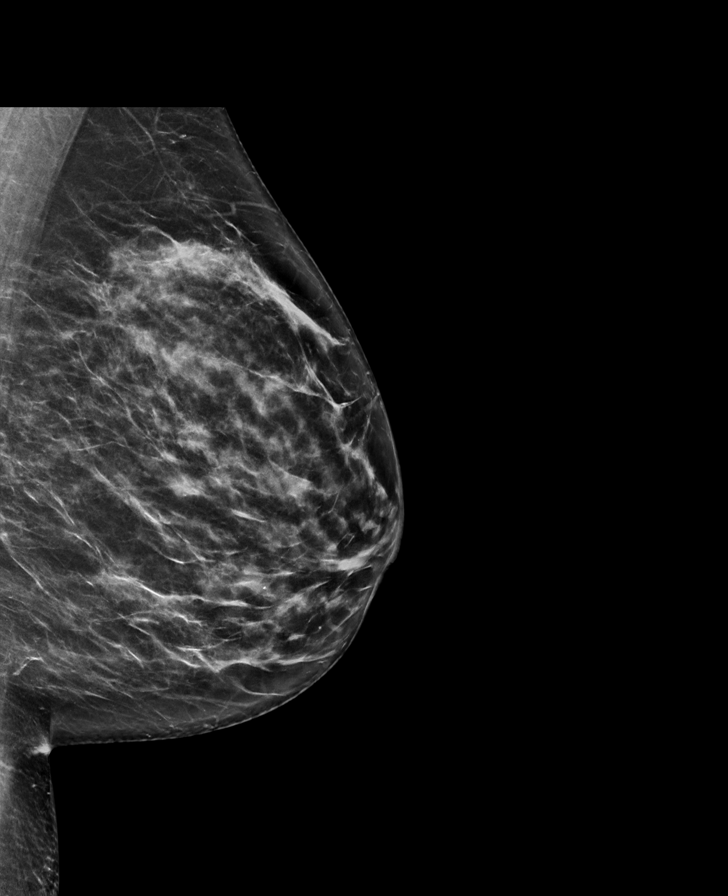

[L CC synth-2D]
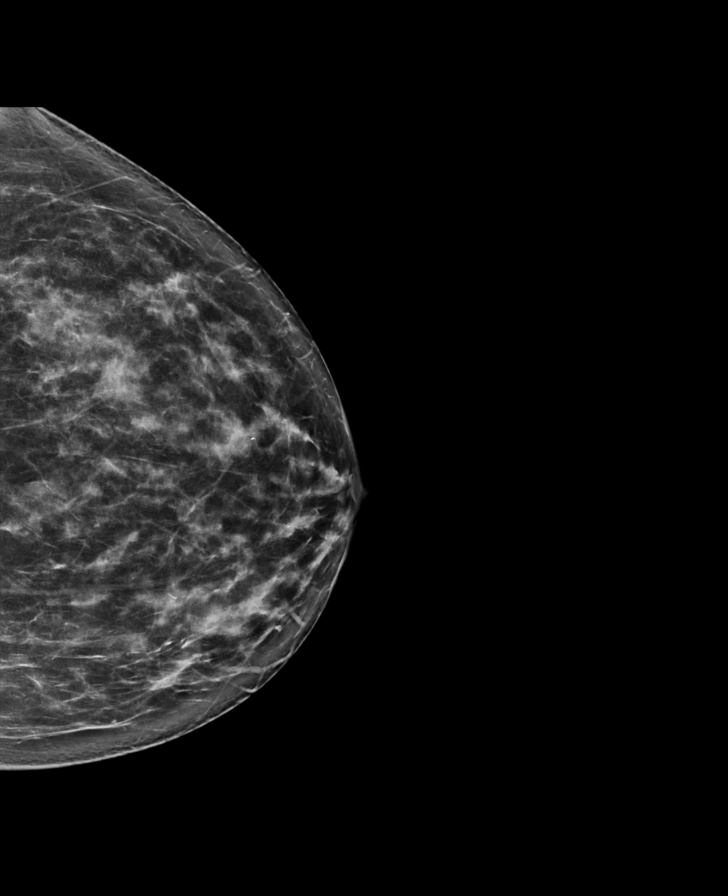

[L MLO tomo · tomo slice 37/73.0]
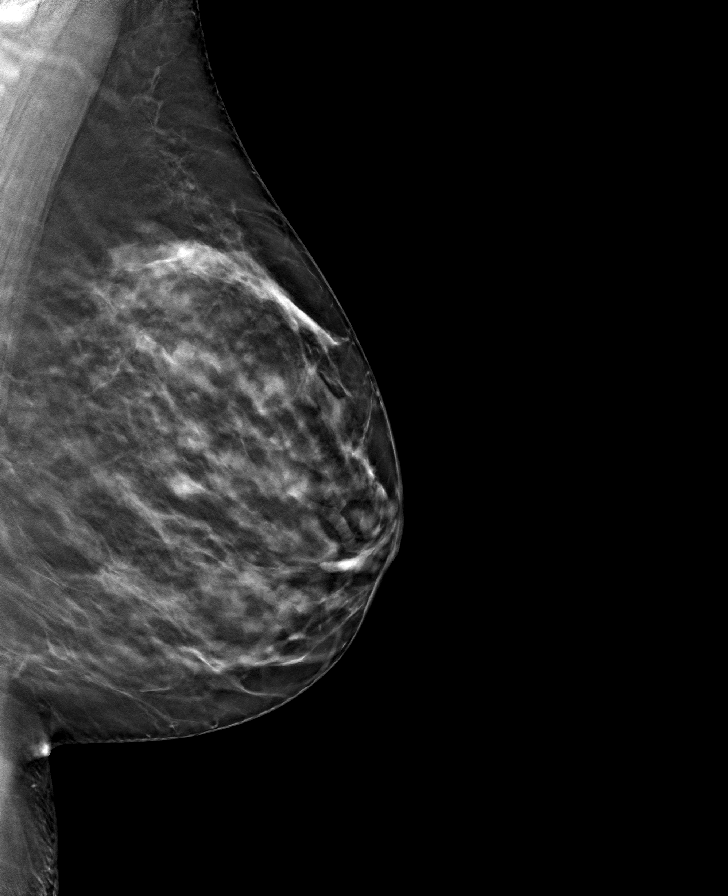

[R MLO tomo · tomo slice 37/74.0]
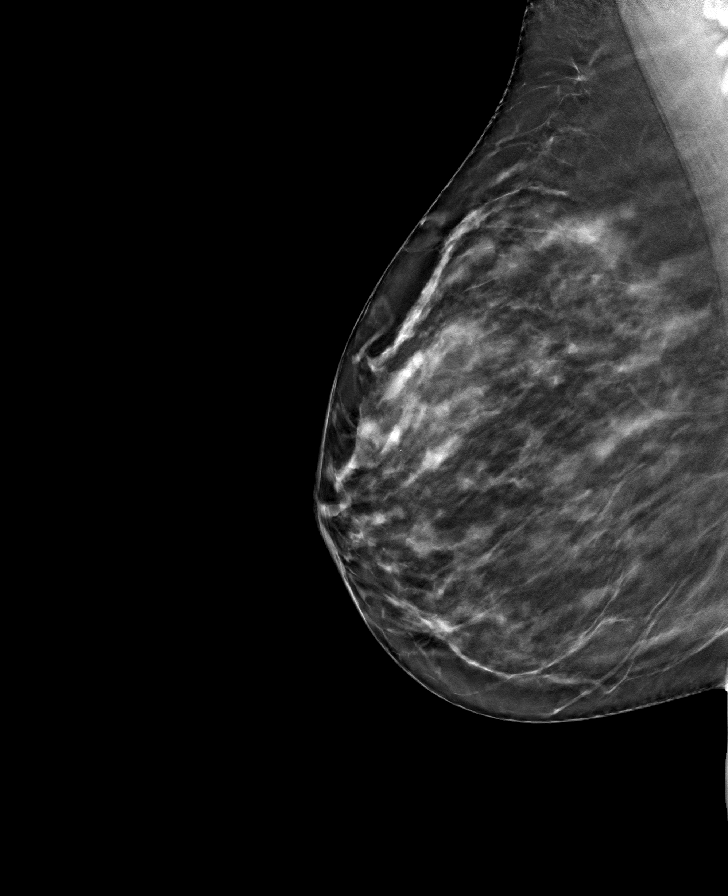

[R CC tomo · tomo slice 37/74.0]
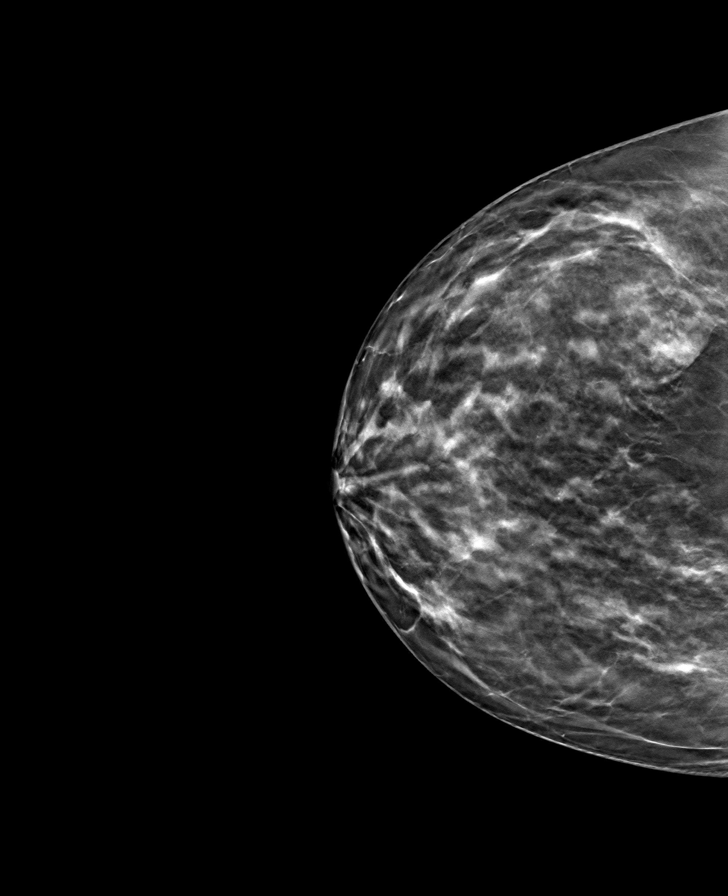

[L CC tomo · tomo slice 37/74.0]
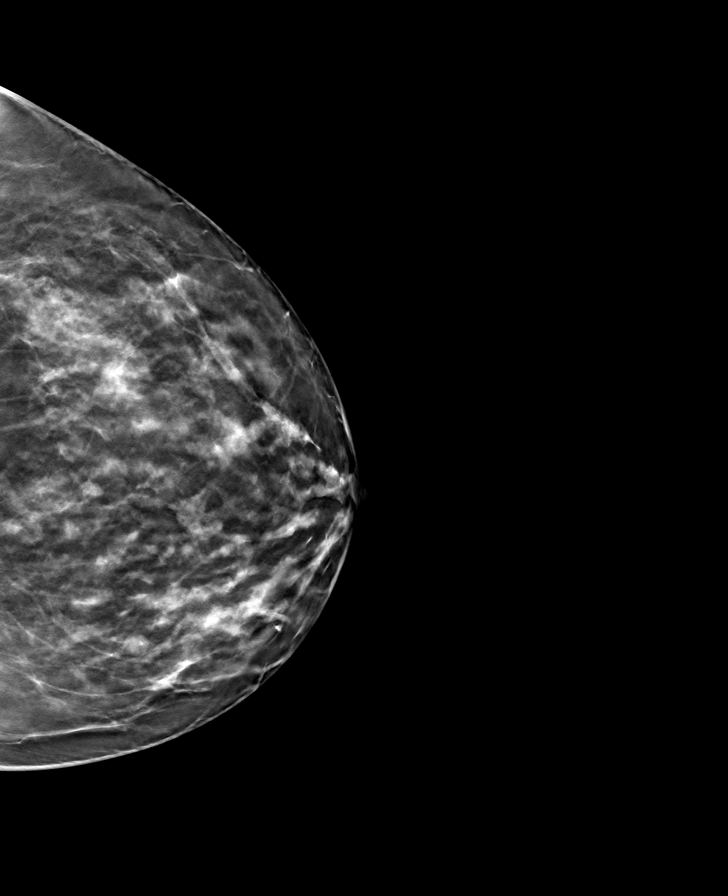

[8 of 24 positions shown; findings below may reference images not displayed]

ACR Breast Density Category c: The breast tissue is heterogeneously
dense, which may obscure small masses.
FINDINGS: There are no findings suspicious for malignancy.
IMPRESSION: No mammographic evidence of malignancy. A result letter of this
screening mammogram will be mailed directly to the patient.

RECOMMENDATION:
Screening mammogram in one year. (Code:Q3-W-BC3)

BI-RADS CATEGORY  1: Negative.

## 2023-10-05 ENCOUNTER — Other Ambulatory Visit: Payer: Self-pay | Admitting: Family Medicine

## 2023-10-05 DIAGNOSIS — Z1231 Encounter for screening mammogram for malignant neoplasm of breast: Secondary | ICD-10-CM

## 2023-10-13 ENCOUNTER — Ambulatory Visit
Admission: RE | Admit: 2023-10-13 | Discharge: 2023-10-13 | Disposition: A | Discharge status: Home / Self Care | Payer: BC Managed Care – PPO | Source: Ambulatory Visit | Attending: Family Medicine | Admitting: Family Medicine

## 2023-10-13 DIAGNOSIS — Z1231 Encounter for screening mammogram for malignant neoplasm of breast: Secondary | ICD-10-CM

## 2024-03-01 ENCOUNTER — Other Ambulatory Visit: Payer: Self-pay

## 2024-03-01 ENCOUNTER — Emergency Department (HOSPITAL_BASED_OUTPATIENT_CLINIC_OR_DEPARTMENT_OTHER)
Admission: EM | Admit: 2024-03-01 | Discharge: 2024-03-01 | Disposition: A | Discharge status: Home / Self Care | Attending: Emergency Medicine | Admitting: Emergency Medicine

## 2024-03-01 ENCOUNTER — Encounter (HOSPITAL_BASED_OUTPATIENT_CLINIC_OR_DEPARTMENT_OTHER): Payer: Self-pay | Admitting: Emergency Medicine

## 2024-03-01 DIAGNOSIS — R197 Diarrhea, unspecified: Secondary | ICD-10-CM | POA: Diagnosis not present

## 2024-03-01 DIAGNOSIS — R112 Nausea with vomiting, unspecified: Secondary | ICD-10-CM | POA: Insufficient documentation

## 2024-03-01 DIAGNOSIS — R059 Cough, unspecified: Secondary | ICD-10-CM | POA: Diagnosis not present

## 2024-03-01 DIAGNOSIS — R1013 Epigastric pain: Secondary | ICD-10-CM | POA: Insufficient documentation

## 2024-03-01 LAB — CBC WITH DIFFERENTIAL/PLATELET
Abs Immature Granulocytes: 0.01 10*3/uL (ref 0.00–0.07)
Basophils Absolute: 0 10*3/uL (ref 0.0–0.1)
Basophils Relative: 1 %
Eosinophils Absolute: 0 10*3/uL (ref 0.0–0.5)
Eosinophils Relative: 0 %
HCT: 36.4 % (ref 36.0–46.0)
Hemoglobin: 12.4 g/dL (ref 12.0–15.0)
Immature Granulocytes: 0 %
Lymphocytes Relative: 49 %
Lymphs Abs: 2.9 10*3/uL (ref 0.7–4.0)
MCH: 28.2 pg (ref 26.0–34.0)
MCHC: 34.1 g/dL (ref 30.0–36.0)
MCV: 82.7 fL (ref 80.0–100.0)
Monocytes Absolute: 0.5 10*3/uL (ref 0.1–1.0)
Monocytes Relative: 9 %
Neutro Abs: 2.5 10*3/uL (ref 1.7–7.7)
Neutrophils Relative %: 41 %
Platelets: 362 10*3/uL (ref 150–400)
RBC: 4.4 MIL/uL (ref 3.87–5.11)
RDW: 15.1 % (ref 11.5–15.5)
WBC: 6 10*3/uL (ref 4.0–10.5)
nRBC: 0 % (ref 0.0–0.2)

## 2024-03-01 LAB — COMPREHENSIVE METABOLIC PANEL
ALT: 26 U/L (ref 0–44)
AST: 27 U/L (ref 15–41)
Albumin: 4 g/dL (ref 3.5–5.0)
Alkaline Phosphatase: 72 U/L (ref 38–126)
Anion gap: 11 (ref 5–15)
BUN: 17 mg/dL (ref 6–20)
CO2: 18 mmol/L — ABNORMAL LOW (ref 22–32)
Calcium: 8.8 mg/dL — ABNORMAL LOW (ref 8.9–10.3)
Chloride: 107 mmol/L (ref 98–111)
Creatinine, Ser: 0.89 mg/dL (ref 0.44–1.00)
GFR, Estimated: >60 mL/min (ref >60)
Glucose, Bld: 119 mg/dL — ABNORMAL HIGH (ref 70–99)
Potassium: 3.3 mmol/L — ABNORMAL LOW (ref 3.5–5.1)
Sodium: 136 mmol/L (ref 135–145)
Total Bilirubin: 0.6 mg/dL (ref 0.0–1.2)
Total Protein: 7.8 g/dL (ref 6.5–8.1)

## 2024-03-01 LAB — LIPASE, BLOOD: Lipase: 37 U/L (ref 11–51)

## 2024-03-01 MED ORDER — ONDANSETRON 4 MG PO TBDP: ORAL_TABLET | ORAL | 0 refills | Status: AC

## 2024-03-01 MED ORDER — KETOROLAC TROMETHAMINE 15 MG/ML IJ SOLN
15.0000 mg | Freq: Once | INTRAMUSCULAR | Status: AC
  Administered 2024-03-01: 15 mg via INTRAVENOUS
  Filled 2024-03-01: qty 1

## 2024-03-01 MED ORDER — BENZONATATE 100 MG PO CAPS
100.0000 mg | ORAL_CAPSULE | Freq: Three times a day (TID) | ORAL | 0 refills | Status: AC
Start: 2024-03-01 — End: ?

## 2024-03-01 MED ORDER — SODIUM CHLORIDE 0.9 % IV BOLUS
1000.0000 mL | Freq: Once | INTRAVENOUS | Status: AC
  Administered 2024-03-01: 1000 mL via INTRAVENOUS

## 2024-03-01 MED ORDER — ONDANSETRON HCL 4 MG/2ML IJ SOLN
4.0000 mg | Freq: Once | INTRAMUSCULAR | Status: AC
  Administered 2024-03-01: 4 mg via INTRAVENOUS
  Filled 2024-03-01: qty 2

## 2024-03-01 MED ORDER — ALUM & MAG HYDROXIDE-SIMETH 200-200-20 MG/5ML PO SUSP
30.0000 mL | Freq: Once | ORAL | Status: AC
  Administered 2024-03-01: 30 mL via ORAL
  Filled 2024-03-01: qty 30

## 2024-03-01 NOTE — ED Triage Notes (Signed)
 Abd pain with n/v/d since Thursday

## 2024-03-01 NOTE — ED Notes (Signed)
 Reviewed discharge instructions, follow up and medications with pt. No vomiting while in treatment room. Pt states understanding . Discharged with husband

## 2024-03-01 NOTE — ED Provider Notes (Signed)
 [facility] Provider Note   CSN: [id number] Arrival date & time: 03/01/24  6962     History  Chief Complaint  Patient presents with   Abdominal Pain    Wendy Huang is a 58 y.o. female.  58 yo F with a chief complaints of nausea vomiting diarrhea cough congestion fever chills myalgias going on for about 4 days or so now.  No known sick contacts.  No difficulty breathing.  Has been able to eat and drink some at home.  She just feels exhausted and rundown and wanted something to make her feel better.   Abdominal Pain      Home Medications Prior to Admission medications   Medication Sig Start Date End Date Taking? Authorizing Provider  benzonatate (TESSALON) 100 MG capsule Take 1 capsule (100 mg total) by mouth every 8 (eight) hours. 03/01/24  Yes Melene Plan, DO  ondansetron (ZOFRAN-ODT) 4 MG disintegrating tablet 4mg  ODT q4 hours prn nausea/vomit 03/01/24  Yes Melene Plan, DO  ALPRAZolam Prudy Feeler) 0.5 MG tablet Take 0.5 mg by mouth at bedtime as needed.    [provider]  amLODipine (NORVASC) 5 MG tablet Take 5 mg by mouth daily.    [provider]  levothyroxine (SYNTHROID, LEVOTHROID) 112 MCG tablet Take 112 mcg by mouth daily.    [provider]      Allergies    Bee venom and Simvastatin    Review of Systems   Review of Systems  Gastrointestinal:  Positive for abdominal pain.    Physical Exam Updated Vital Signs BP 139/82   Pulse 77   Temp 98.4 F (36.9 C)   Resp 18   Ht 5' 1.5" (1.562 m)   Wt 93 kg   LMP 05/12/2014   SpO2 100%   BMI 38.11 kg/m  Physical Exam Vitals and nursing note reviewed.  Constitutional:      General: She is not in acute distress.    Appearance: She is well-developed. She is not diaphoretic.  HENT:     Head: Normocephalic and atraumatic.  Eyes:     Pupils: Pupils are equal, round, and reactive to light.  Cardiovascular:     Rate and Rhythm: Normal rate and  regular rhythm.     Heart sounds: No murmur heard.    No friction rub. No gallop.  Pulmonary:     Effort: Pulmonary effort is normal.     Breath sounds: No wheezing or rales.  Abdominal:     General: There is no distension.     Palpations: Abdomen is soft.     Tenderness: There is abdominal tenderness.     Comments: Mild epigastric discomfort  Musculoskeletal:        General: No tenderness.     Cervical back: Normal range of motion and neck supple.  Skin:    General: Skin is warm and dry.  Neurological:     Mental Status: She is alert and oriented to person, place, and time.  Psychiatric:        Behavior: Behavior normal.     ED Results / Procedures / Treatments   Labs (all labs ordered are listed, but only abnormal results are displayed) Labs Reviewed  COMPREHENSIVE METABOLIC PANEL - Abnormal; Notable for the following components:      Result Value   Potassium 3.3 (*)    CO2 18 (*)    Glucose, Bld 119 (*)    Calcium 8.8 (*)  All other components within normal limits  CBC WITH DIFFERENTIAL/PLATELET  LIPASE, BLOOD  URINALYSIS, ROUTINE W REFLEX MICROSCOPIC    EKG None  Radiology No results found.  Procedures Procedures    Medications Ordered in ED Medications  sodium chloride 0.9 % bolus 1,000 mL (1,000 mLs Intravenous New Bag/Given 03/01/24 0812)  ondansetron (ZOFRAN) injection 4 mg (4 mg Intravenous Given 03/01/24 0812)  ketorolac (TORADOL) 15 MG/ML injection 15 mg (15 mg Intravenous Given 03/01/24 0816)  alum & mag hydroxide-simeth (MAALOX/MYLANTA) 200-200-20 MG/5ML suspension 30 mL (30 mLs Oral Given 03/01/24 4098)    ED Course/ Medical Decision Making/ A&P                                 Medical Decision Making Risk OTC drugs. Prescription drug management.   58 yo F with a chief complaints of cough congestion fevers chills myalgias nausea vomiting and diarrhea going on for about 4 days now.  She is well-appearing and nontoxic.  Appears  well-hydrated.  Lab work is resulted without leukocytosis no acute anemia she has a metabolic acidosis without anion gap that is likely secondary to her diarrhea.  LFTs and lipase are unremarkable.  Patient is requesting symptomatic therapy.  Will give a bolus of IV fluids antiemetics.  Reassess.   Patient is feeling better on repeat assessment.  Will discharge home.  PCP follow-up.  9:29 AM:  I have discussed the diagnosis/risks/treatment options with the patient and family.  Evaluation and diagnostic testing in the emergency department does not suggest an emergent condition requiring admission or immediate intervention beyond what has been performed at this time.  They will follow up with PCP. We also discussed returning to the ED immediately if new or worsening sx occur. We discussed the sx which are most concerning (e.g., sudden worsening pain, fever, inability to tolerate by mouth) that necessitate immediate return. Medications administered to the patient during their visit and any new prescriptions provided to the patient are listed below.  Medications given during this visit Medications  sodium chloride 0.9 % bolus 1,000 mL (1,000 mLs Intravenous New Bag/Given 03/01/24 0812)  ondansetron (ZOFRAN) injection 4 mg (4 mg Intravenous Given 03/01/24 0812)  ketorolac (TORADOL) 15 MG/ML injection 15 mg (15 mg Intravenous Given 03/01/24 0816)  alum & mag hydroxide-simeth (MAALOX/MYLANTA) 200-200-20 MG/5ML suspension 30 mL (30 mLs Oral Given 03/01/24 1191)     The patient appears reasonably screen and/or stabilized for discharge and I doubt any other medical condition or other [facility] requiring further screening, evaluation, or treatment in the ED at this time prior to discharge.         Final Clinical Impression(s) / ED Diagnoses Final diagnoses:  Nausea vomiting and diarrhea  Cough in adult    Rx / DC Orders ED Discharge Orders          Ordered    benzonatate (TESSALON) 100 MG capsule   Every 8 hours        03/01/24 0927    ondansetron (ZOFRAN-ODT) 4 MG disintegrating tablet        03/01/24 0927              Melene Plan, DO 03/01/24 [phone]

## 2024-03-01 NOTE — Discharge Instructions (Signed)
 Try pepcid or tagamet up to twice a day.  Try to avoid things that may make this worse, most commonly these are spicy foods tomato based products fatty foods chocolate and peppermint.  Alcohol and tobacco can also make this worse.  Return to the emergency department for sudden worsening pain fever or inability to eat or drink.  Take tylenol 2 pills 4 times a day and motrin 4 pills 3 times a day.  Drink plenty of fluids.  Return for worsening shortness of breath, headache, confusion. Follow up with your family doctor.
